# Patient Record
Sex: Female | Born: 1960 | Race: White | Hispanic: No | Marital: Married
Health system: Southern US, Community
[De-identification: ages and names within clinical notes are randomized; demographics above are authoritative.]

## PROBLEM LIST (undated history)

## (undated) HISTORY — PX: BACK SURGERY: SHX140

---

## 2003-04-03 ENCOUNTER — Other Ambulatory Visit: Payer: Self-pay

## 2005-11-22 ENCOUNTER — Other Ambulatory Visit: Payer: Self-pay

## 2005-11-22 ENCOUNTER — Emergency Department: Payer: Self-pay | Admitting: Emergency Medicine

## 2005-12-06 ENCOUNTER — Ambulatory Visit: Payer: Self-pay | Admitting: Internal Medicine

## 2005-12-07 ENCOUNTER — Ambulatory Visit: Payer: Self-pay | Admitting: Internal Medicine

## 2007-04-11 ENCOUNTER — Ambulatory Visit: Payer: Self-pay | Admitting: Emergency Medicine

## 2007-05-17 ENCOUNTER — Emergency Department: Payer: Self-pay | Admitting: Unknown Physician Specialty

## 2007-05-17 ENCOUNTER — Other Ambulatory Visit: Payer: Self-pay

## 2012-05-08 ENCOUNTER — Ambulatory Visit: Payer: Self-pay | Admitting: Family Medicine

## 2016-11-26 ENCOUNTER — Emergency Department: Payer: Worker's Compensation

## 2016-11-26 ENCOUNTER — Encounter: Payer: Self-pay | Admitting: Emergency Medicine

## 2016-11-26 ENCOUNTER — Emergency Department
Admission: EM | Admit: 2016-11-26 | Discharge: 2016-11-26 | Disposition: A | Discharge status: Home / Self Care | Payer: Worker's Compensation | Attending: Emergency Medicine | Admitting: Emergency Medicine

## 2016-11-26 DIAGNOSIS — Y998 Other external cause status: Secondary | ICD-10-CM | POA: Diagnosis not present

## 2016-11-26 DIAGNOSIS — S0093XA Contusion of unspecified part of head, initial encounter: Secondary | ICD-10-CM | POA: Insufficient documentation

## 2016-11-26 DIAGNOSIS — R51 Headache: Secondary | ICD-10-CM | POA: Insufficient documentation

## 2016-11-26 DIAGNOSIS — Y929 Unspecified place or not applicable: Secondary | ICD-10-CM | POA: Diagnosis not present

## 2016-11-26 DIAGNOSIS — Y9389 Activity, other specified: Secondary | ICD-10-CM | POA: Diagnosis not present

## 2016-11-26 DIAGNOSIS — W500XXA Accidental hit or strike by another person, initial encounter: Secondary | ICD-10-CM | POA: Insufficient documentation

## 2016-11-26 DIAGNOSIS — S0993XA Unspecified injury of face, initial encounter: Secondary | ICD-10-CM | POA: Diagnosis present

## 2016-11-26 MED ORDER — ONDANSETRON 4 MG PO TBDP
4.0000 mg | ORAL_TABLET | Freq: Once | ORAL | Status: AC
  Administered 2016-11-26: 4 mg via ORAL
  Filled 2016-11-26: qty 1

## 2016-11-26 MED ORDER — MELOXICAM 7.5 MG PO TABS: 7.5000 mg | ORAL_TABLET | Freq: Every day | ORAL | 1 refills | Status: AC

## 2016-11-26 MED ORDER — HYDROCODONE-ACETAMINOPHEN 5-325 MG PO TABS: 1.0000 | ORAL_TABLET | Freq: Four times a day (QID) | ORAL | 0 refills | Status: AC | PRN

## 2016-11-26 MED ORDER — HYDROCODONE-ACETAMINOPHEN 5-325 MG PO TABS
1.0000 | ORAL_TABLET | Freq: Once | ORAL | Status: AC
  Administered 2016-11-26: 1 via ORAL
  Filled 2016-11-26: qty 1

## 2016-11-26 MED ORDER — KETOROLAC TROMETHAMINE 30 MG/ML IJ SOLN
30.0000 mg | Freq: Once | INTRAMUSCULAR | Status: AC
  Administered 2016-11-26: 30 mg via INTRAVENOUS
  Filled 2016-11-26: qty 1

## 2016-11-26 MED ORDER — ONDANSETRON HCL 4 MG PO TABS: 4.0000 mg | ORAL_TABLET | Freq: Three times a day (TID) | ORAL | 1 refills | Status: AC | PRN
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
## 2018-12-06 ENCOUNTER — Other Ambulatory Visit: Payer: Self-pay

## 2018-12-06 DIAGNOSIS — Z20822 Contact with and (suspected) exposure to covid-19: Secondary | ICD-10-CM

## 2018-12-07 LAB — NOVEL CORONAVIRUS, NAA: SARS-CoV-2, NAA: NOT DETECTED

## 2019-04-28 ENCOUNTER — Ambulatory Visit: Payer: Self-pay | Attending: Internal Medicine

## 2019-04-28 DIAGNOSIS — Z23 Encounter for immunization: Secondary | ICD-10-CM | POA: Insufficient documentation

## 2019-04-28 NOTE — Progress Notes (Signed)
   Covid-19 Vaccination Clinic  Name:  Janet Bell    MRN: [id number] DOB: 12/25/1960  04/28/2019  [name] was observed post Covid-19 immunization for 15 minutes without incidence. She was provided with Vaccine Information Sheet and instruction to access the V-Safe system.   [name] was instructed to call 911 with any severe reactions post vaccine: Marland Kitchen Difficulty breathing  . Swelling of your face and throat  . A fast heartbeat  . A bad rash all over your body  . Dizziness and weakness    Immunizations Administered    Name Date Dose VIS Date Route   Moderna COVID-19 Vaccine 04/28/2019 10:20 AM 0.5 mL 01/30/2019 Intramuscular   Manufacturer: Moderna   Lot: [id number]   NDC: 22583-462-19

## 2019-05-26 ENCOUNTER — Ambulatory Visit: Payer: Self-pay | Attending: Internal Medicine

## 2019-05-26 DIAGNOSIS — Z23 Encounter for immunization: Secondary | ICD-10-CM

## 2019-05-26 NOTE — Progress Notes (Signed)
   Covid-19 Vaccination Clinic  Name:  Janet Bell    MRN: [id number] DOB: June 01, 1960  05/26/2019  [name] was observed post Covid-19 immunization for 15 minutes without incident. She was provided with Vaccine Information Sheet and instruction to access the V-Safe system.   [name] was instructed to call 911 with any severe reactions post vaccine: Marland Kitchen Difficulty breathing  . Swelling of face and throat  . A fast heartbeat  . A bad rash all over body  . Dizziness and weakness   Immunizations Administered    Name Date Dose VIS Date Route   Moderna COVID-19 Vaccine 05/26/2019  2:34 PM 0.5 mL 01/30/2019 Intramuscular   Manufacturer: Gala Murdoch   Lot: [id number]   NDC: 17915-056-97

## 2019-05-29 ENCOUNTER — Ambulatory Visit: Payer: Self-pay
